# Patient Record
Sex: Male | Born: 1980 | Race: White | Hispanic: No | Smoking: Current every day smoker
Health system: Southern US, Community
[De-identification: ages and names within clinical notes are randomized; demographics above are authoritative.]

## PROBLEM LIST (undated history)

## (undated) DIAGNOSIS — S069XAA Unspecified intracranial injury with loss of consciousness status unknown, initial encounter: Secondary | ICD-10-CM

## (undated) DIAGNOSIS — S069X9A Unspecified intracranial injury with loss of consciousness of unspecified duration, initial encounter: Secondary | ICD-10-CM

---

## 2000-06-17 ENCOUNTER — Emergency Department (HOSPITAL_COMMUNITY): Admission: EM | Admit: 2000-06-17 | Discharge: 2000-06-17 | Payer: Self-pay | Admitting: Family Medicine

## 2001-03-20 ENCOUNTER — Emergency Department (HOSPITAL_COMMUNITY): Admission: EM | Admit: 2001-03-20 | Discharge: 2001-03-20 | Payer: Self-pay | Admitting: Emergency Medicine

## 2004-05-05 ENCOUNTER — Emergency Department (HOSPITAL_COMMUNITY): Admission: EM | Admit: 2004-05-05 | Discharge: 2004-05-05 | Payer: Self-pay | Admitting: Emergency Medicine

## 2006-09-25 ENCOUNTER — Emergency Department (HOSPITAL_COMMUNITY): Admission: EM | Admit: 2006-09-25 | Discharge: 2006-09-26 | Payer: Self-pay | Admitting: Emergency Medicine

## 2008-03-28 ENCOUNTER — Emergency Department (HOSPITAL_COMMUNITY): Admission: EM | Admit: 2008-03-28 | Discharge: 2008-03-28 | Payer: Self-pay | Admitting: Emergency Medicine

## 2010-05-29 ENCOUNTER — Emergency Department (HOSPITAL_COMMUNITY): Admission: EM | Admit: 2010-05-29 | Discharge: 2010-05-30 | Payer: Self-pay | Admitting: Emergency Medicine

## 2011-03-19 DIAGNOSIS — X500XXA Overexertion from strenuous movement or load, initial encounter: Secondary | ICD-10-CM | POA: Insufficient documentation

## 2011-03-19 DIAGNOSIS — T148XXA Other injury of unspecified body region, initial encounter: Secondary | ICD-10-CM | POA: Insufficient documentation

## 2011-03-19 DIAGNOSIS — Y93K1 Activity, walking an animal: Secondary | ICD-10-CM | POA: Insufficient documentation

## 2011-03-20 ENCOUNTER — Emergency Department (HOSPITAL_COMMUNITY)
Admission: EM | Admit: 2011-03-20 | Discharge: 2011-03-20 | Disposition: A | Discharge status: Home / Self Care | Payer: BC Managed Care – PPO | Attending: Emergency Medicine | Admitting: Emergency Medicine

## 2011-07-22 ENCOUNTER — Emergency Department (HOSPITAL_COMMUNITY)
Admission: EM | Admit: 2011-07-22 | Discharge: 2011-07-23 | Disposition: A | Discharge status: Home / Self Care | Payer: BC Managed Care – PPO | Attending: Emergency Medicine | Admitting: Emergency Medicine

## 2011-07-22 DIAGNOSIS — Z23 Encounter for immunization: Secondary | ICD-10-CM | POA: Insufficient documentation

## 2011-07-22 DIAGNOSIS — S61209A Unspecified open wound of unspecified finger without damage to nail, initial encounter: Secondary | ICD-10-CM | POA: Insufficient documentation

## 2011-07-22 DIAGNOSIS — W540XXA Bitten by dog, initial encounter: Secondary | ICD-10-CM | POA: Insufficient documentation

## 2011-12-31 ENCOUNTER — Encounter (HOSPITAL_COMMUNITY): Payer: Self-pay | Admitting: Emergency Medicine

## 2011-12-31 ENCOUNTER — Emergency Department (HOSPITAL_COMMUNITY)
Admission: EM | Admit: 2011-12-31 | Discharge: 2011-12-31 | Disposition: A | Discharge status: Home / Self Care | Payer: BC Managed Care – PPO | Attending: Emergency Medicine | Admitting: Emergency Medicine

## 2011-12-31 DIAGNOSIS — R6883 Chills (without fever): Secondary | ICD-10-CM | POA: Insufficient documentation

## 2011-12-31 DIAGNOSIS — R599 Enlarged lymph nodes, unspecified: Secondary | ICD-10-CM | POA: Insufficient documentation

## 2011-12-31 DIAGNOSIS — J069 Acute upper respiratory infection, unspecified: Secondary | ICD-10-CM | POA: Insufficient documentation

## 2011-12-31 DIAGNOSIS — R05 Cough: Secondary | ICD-10-CM | POA: Insufficient documentation

## 2011-12-31 DIAGNOSIS — R220 Localized swelling, mass and lump, head: Secondary | ICD-10-CM | POA: Insufficient documentation

## 2011-12-31 DIAGNOSIS — R059 Cough, unspecified: Secondary | ICD-10-CM | POA: Insufficient documentation

## 2011-12-31 DIAGNOSIS — J3489 Other specified disorders of nose and nasal sinuses: Secondary | ICD-10-CM | POA: Insufficient documentation

## 2011-12-31 NOTE — ED Notes (Signed)
Pt alert,nad c/o URI s/s, onset Friday, resp even unlabored, skin pwd

## 2011-12-31 NOTE — ED Provider Notes (Signed)
History     CSN: [id number]  Arrival date & time 12/31/11  2004   First MD Initiated Contact with Patient 12/31/11 2241      Chief Complaint  Patient presents with  . URI    (Consider location/radiation/quality/duration/timing/severity/associated sxs/prior treatment) Patient is a 31 y.o. male presenting with cough. The history is provided by the patient.  Cough This is a new problem. Episode onset: 3 days ago. The problem has been gradually worsening. The cough is productive of sputum. There has been no fever. Associated symptoms include chills and rhinorrhea. Pertinent negatives include no chest pain, no sweats, no weight loss, no ear congestion, no ear pain, no headaches, no sore throat, no myalgias, no shortness of breath and no wheezing. He has tried nothing for the symptoms. He is a smoker. His past medical history does not include pneumonia or asthma.    History reviewed. No pertinent past medical history.  History reviewed. No pertinent past surgical history.  No family history on file.  History  Substance Use Topics  . Smoking status: Current Everyday Smoker    Types: Cigarettes  . Smokeless tobacco: Not on file  . Alcohol Use: No      Review of Systems  Constitutional: Positive for chills. Negative for weight loss.  HENT: Positive for rhinorrhea. Negative for ear pain and sore throat.   Respiratory: Positive for cough. Negative for shortness of breath and wheezing.   Cardiovascular: Negative for chest pain.  Musculoskeletal: Negative for myalgias.  Neurological: Negative for headaches.    Allergies  Review of patient's allergies indicates no known allergies.  Home Medications  No current outpatient prescriptions on file.  BP 113/81  Pulse 94  Temp 98 F (36.7 C)  Resp 16  Wt 280 lb (127.007 kg)  SpO2 93%  Physical Exam  Nursing note and vitals reviewed. Constitutional: He is oriented to person, place, and time. He appears well-developed and  well-nourished. No distress.  HENT:  Head: Normocephalic and atraumatic.  Right Ear: Tympanic membrane and ear canal normal.  Left Ear: Tympanic membrane and ear canal normal.  Nose: Mucosal edema and rhinorrhea present.  Mouth/Throat: Uvula is midline, oropharynx is clear and moist and mucous membranes are normal.  Neck: Normal range of motion. Neck supple.  Cardiovascular: Normal rate, regular rhythm and normal heart sounds.   Pulmonary/Chest: Effort normal. No accessory muscle usage. Not tachypneic. No respiratory distress. He has no decreased breath sounds. He has wheezes. He has no rhonchi. He has no rales.       Inspiratory and expiratory wheezing of the right upper lung and right lower lung.  Lymphadenopathy:    He has cervical adenopathy.  Neurological: He is alert and oriented to person, place, and time.  Skin: Skin is warm and dry. No rash noted. He is not diaphoretic.  Psychiatric: He has a normal mood and affect.    ED Course  Procedures (including critical care time)  Labs Reviewed - No data to display No results found.   No diagnosis found.  Patient refused CXR.  Wanted to order a chest xray due to localized inspiratory and expiratory wheezing on exam and pulse ox 93.  Patient does smoke more than 1ppd.  Therefore, pulse ox of 93 may be baseline for him.  Patient is afebrile.  MDM  Feel that symptoms are most likely Viral URI.  Patient refused CXR.  Patient not in any respiratory distress.  Patient educated on smoking cessation and instructed to return to  ED if symptoms worsen.  Patient in agreement.        Pascal Lux [place], PA-C 01/01/12 [phone]

## 2011-12-31 NOTE — Discharge Instructions (Signed)
Read the instructions below on reasons to return to the emergency department and to learn more about your diagnosis.  Use over the counter medications for symptomatic relief as we discussed (mucinex as a decongestant, Tylenol for fever/pain, Motrin/Ibuprofen for muscle aches). If prescribed a cough suppressant during your visit, do not operate heavy machinery with in 5 hours of taking this medication. Followup with your primary care doctor in 4 days if your symptoms persist.  Your more than welcome to return to the emergency department if symptoms worsen or become concerning.  Upper Respiratory Infection, Adult  An upper respiratory infection (URI) is also sometimes known as the common cold. Most people improve within 1 week, but symptoms can last up to 2 weeks. A residual cough may last even longer.   URI is most commonly caused by a virus. Viruses are NOT treated with antibiotics. You can easily spread the virus to others by oral contact. This includes kissing, sharing a glass, coughing, or sneezing. Touching your mouth or nose and then touching a surface, which is then touched by another person, can also spread the virus.   TREATMENT  Treatment is directed at relieving symptoms. There is no cure. Antibiotics are not effective, because the infection is caused by a virus, not by bacteria. Treatment may include:  Increased fluid intake. Sports drinks offer valuable electrolytes, sugars, and fluids.  Breathing heated mist or steam (vaporizer or shower).  Eating chicken soup or other clear broths, and maintaining good nutrition.  Getting plenty of rest.  Using gargles or lozenges for comfort.  Controlling fevers with ibuprofen or acetaminophen as directed by your caregiver.  Increasing usage of your inhaler if you have asthma.  Return to work when your temperature has returned to normal.   SEEK MEDICAL CARE IF:  After the first few days, you feel you are getting worse rather than better.  You  develop worsening shortness of breath, or brown or red sputum. These may be signs of pneumonia.  You develop yellow or brown nasal discharge or pain in the face, especially when you bend forward. These may be signs of sinusitis.  You develop a fever, swollen neck glands, pain with swallowing, or white areas in the back of your throat. These may be signs of strep throat.   RESOURCE GUIDE  Dental Problems  Patients with Medicaid: [facility] [phone] [address]                                           [phone] [address] Phone:  [phone]                                                  Phone:  [phone]  If unable to pay or uninsured, contact:  [facility] or [facility]. to become qualified for the adult dental clinic.  Chronic Pain Problems Contact [facility]  [phone] Patients need to be referred by their primary care doctor.  Insufficient Money for Medicine Contact [employer]:  call "211" or [facility] 301-053-9099.  No Primary Care Doctor  Call [facility]  [phone] Other agencies that provide inexpensive medical care    [facility]  [phone]    [facility]  [phone]    [facility]  [phone]    [facility]  [phone]    [facility]  [phone]    [facility]  [phone]  Psychological Services [facility]  [phone] [facility] Services  [phone] [facility]   [phone] (emergency services [phone])  Substance Abuse Resources [facility]  938-386-1483 [facility] [phone] [facility] [phone] Floydene Flock [phone] Residential & Outpatient Substance Abuse Program  [phone]  Abuse/Neglect [facility] Child Abuse Hotline 8146443279 [facility] Child Abuse Hotline 321-653-0779 (After Hours)  Emergency [facility]ter [facility] [phone]  M[facility] [place] [facility] [phone] [facility] 9085467788  MRSA Hotline #:   858-857-0103    [facility] Resources  [facility]     [employer]                          [facility] Dept. [address] [place]                       [address]      [address]  Blondell Reveal Phone:  [phone]                                   Phone:  [phone]                 Phone:  [phone]  [facility] Mental Health Phone:  [phone]  [facility] Child Abuse Hotline ([phone] 007-1993 [phone] (After Hours)

## 2012-01-01 NOTE — ED Provider Notes (Signed)
Medical screening examination/treatment/procedure(s) were performed by non-physician practitioner and as supervising physician I was immediately available for consultation/collaboration.   Tu Shimmel M Karita Dralle, MD 01/01/12 0936 

## 2013-01-23 ENCOUNTER — Encounter (HOSPITAL_COMMUNITY): Payer: Self-pay | Admitting: Emergency Medicine

## 2013-01-23 ENCOUNTER — Emergency Department (HOSPITAL_COMMUNITY)
Admission: EM | Admit: 2013-01-23 | Discharge: 2013-01-23 | Disposition: A | Discharge status: Home / Self Care | Payer: BC Managed Care – PPO | Attending: Emergency Medicine | Admitting: Emergency Medicine

## 2013-01-23 DIAGNOSIS — Y9389 Activity, other specified: Secondary | ICD-10-CM | POA: Insufficient documentation

## 2013-01-23 DIAGNOSIS — M545 Low back pain, unspecified: Secondary | ICD-10-CM | POA: Insufficient documentation

## 2013-01-23 DIAGNOSIS — Y929 Unspecified place or not applicable: Secondary | ICD-10-CM | POA: Insufficient documentation

## 2013-01-23 DIAGNOSIS — F17200 Nicotine dependence, unspecified, uncomplicated: Secondary | ICD-10-CM | POA: Insufficient documentation

## 2013-01-23 DIAGNOSIS — T148XXA Other injury of unspecified body region, initial encounter: Secondary | ICD-10-CM | POA: Insufficient documentation

## 2013-01-23 DIAGNOSIS — X58XXXA Exposure to other specified factors, initial encounter: Secondary | ICD-10-CM | POA: Insufficient documentation

## 2013-01-23 LAB — POCT I-STAT, CHEM 8
Calcium, Ion: 1.2 mmol/L (ref 1.12–1.23)
Creatinine, Ser: 0.9 mg/dL (ref 0.50–1.35)
Glucose, Bld: 88 mg/dL (ref 70–99)
HCT: 49 % (ref 39.0–52.0)
Hemoglobin: 16.7 g/dL (ref 13.0–17.0)
Sodium: 141 mEq/L (ref 135–145)
TCO2: 31 mmol/L (ref 0–100)

## 2013-01-23 LAB — URINALYSIS, ROUTINE W REFLEX MICROSCOPIC
Hgb urine dipstick: NEGATIVE
Leukocytes, UA: NEGATIVE
Specific Gravity, Urine: 1.011 (ref 1.005–1.030)
Urobilinogen, UA: 0.2 mg/dL (ref 0.0–1.0)

## 2013-01-23 MED ORDER — NAPROXEN 500 MG PO TABS: 500.0000 mg | ORAL_TABLET | Freq: Two times a day (BID) | ORAL | Status: DC

## 2013-01-23 NOTE — ED Provider Notes (Signed)
History     CSN: [id number]  Arrival date & time 01/23/13  2026   First MD Initiated Contact with Patient 01/23/13 2103      Chief Complaint  Patient presents with  . Flank Pain   HPI  History provided by the patient. Patient is a 32 year old male with no significant PMH who presents with complaints of right-sided back and flank pains. Pain has been waxing and waning for the past 3 days. He denies any significant injury or trauma. No strenuous activities. Patient does work for [employer] but has had normal activity. Pain does not radiate. Pain is sharp and cramping. It is sometimes worse with position and walking but not necessarily. Patient is not using treatment medications for symptoms. Denies any other aggravating or alleviating factors. No other associated symptoms. No fever, chills or sweats. No nausea vomiting. No diarrhea or constipation. No urinary complaints.    History reviewed. No pertinent past medical history.  History reviewed. No pertinent past surgical history.  No family history on file.  History  Substance Use Topics  . Smoking status: Current Every Day Smoker    Types: Cigarettes  . Smokeless tobacco: Not on file  . Alcohol Use: No      Review of Systems  Constitutional: Negative for fever, chills, diaphoresis and appetite change.  Respiratory: Negative for shortness of breath.   Cardiovascular: Negative for chest pain.  Gastrointestinal: Negative for nausea, vomiting, abdominal pain, diarrhea and constipation.  Genitourinary: Positive for flank pain. Negative for dysuria, frequency, hematuria, discharge, penile swelling, scrotal swelling, penile pain and testicular pain.  Musculoskeletal: Positive for back pain.  All other systems reviewed and are negative.    Allergies  Review of patient's allergies indicates no known allergies.  Home Medications  No current outpatient prescriptions on file.  BP 134/62  Pulse 62  Temp(Src) 98.2 F (36.8 C) (Oral)   Resp 15  Ht 5\' 10"  (1.778 m)  Wt 280 lb (127.007 kg)  BMI 40.18 kg/m2  SpO2 95%  Physical Exam  Nursing note and vitals reviewed. Constitutional: He is oriented to person, place, and time. He appears well-developed and well-nourished. No distress.  HENT:  Head: Normocephalic.  Cardiovascular: Normal rate and regular rhythm.   Pulmonary/Chest: Effort normal and breath sounds normal. No respiratory distress. He has no wheezes. He has no rales.  Abdominal: Soft. There is no tenderness.  Musculoskeletal:       Lumbar back: He exhibits tenderness. He exhibits no bony tenderness and no swelling.       Back:  Neurological: He is alert and oriented to person, place, and time.  Skin: Skin is warm.  Psychiatric: He has a normal mood and affect.    ED Course  Procedures   Results for orders placed during the hospital encounter of 01/23/13  URINALYSIS, ROUTINE W REFLEX MICROSCOPIC      Result Value Range   Color, Urine YELLOW  YELLOW   APPearance CLEAR  CLEAR   Specific Gravity, Urine 1.011  1.005 - 1.030   pH 7.0  5.0 - 8.0   Glucose, UA NEGATIVE  NEGATIVE mg/dL   Hgb urine dipstick NEGATIVE  NEGATIVE   Bilirubin Urine NEGATIVE  NEGATIVE   Ketones, ur NEGATIVE  NEGATIVE mg/dL   Protein, ur NEGATIVE  NEGATIVE mg/dL   Urobilinogen, UA 0.2  0.0 - 1.0 mg/dL   Nitrite NEGATIVE  NEGATIVE   Leukocytes, UA NEGATIVE  NEGATIVE  POCT I-STAT, CHEM 8      Result  Value Range   Sodium 141  135 - 145 mEq/L   Potassium 4.4  3.5 - 5.1 mEq/L   Chloride 104  96 - 112 mEq/L   BUN 11  6 - 23 mg/dL   Creatinine, Ser 4.03  0.50 - 1.35 mg/dL   Glucose, Bld 88  70 - 99 mg/dL   Calcium, Ion 4.74  1.12 - 1.23 mmol/L   TCO2 31  0 - 100 mmol/L   Hemoglobin 16.7  13.0 - 17.0 g/dL   HCT 25.9  56.3 - 87.5 %       1. Muscle strain       MDM  9:15PM patient seen and evaluated. Patient appears well in no acute distress. No signs of significant discomfort or pain.  Patient with unremarkable urine  and Saturday. Symptoms seem more musculoskeletal or in nature. No abdominal pains. Doubt kidney stone. At This time will give symptomatic treatment for muscle strain.      Angus Seller, PA-C 01/24/13 [phone]

## 2013-01-23 NOTE — ED Notes (Signed)
Pt made aware of need for urine specimen 

## 2013-01-23 NOTE — ED Notes (Signed)
Pt alert, arrives fom home, c/o right flank pain, onset was several days ago, describes pain as shooting, intermittent, resp even unlabored, skin pwd, denies changes in bowel or bladder habits

## 2013-01-24 NOTE — ED Provider Notes (Signed)
Medical screening examination/treatment/procedure(s) were performed by non-physician practitioner and as supervising physician I was immediately available for consultation/collaboration.  Christopher J. Pollina, MD 01/24/13 1850 

## 2015-05-26 ENCOUNTER — Encounter (HOSPITAL_COMMUNITY): Payer: Self-pay | Admitting: Emergency Medicine

## 2015-05-26 ENCOUNTER — Emergency Department (HOSPITAL_COMMUNITY)
Admission: EM | Admit: 2015-05-26 | Discharge: 2015-05-26 | Disposition: A | Discharge status: Home / Self Care | Payer: BLUE CROSS/BLUE SHIELD | Attending: Emergency Medicine | Admitting: Emergency Medicine

## 2015-05-26 DIAGNOSIS — Z791 Long term (current) use of non-steroidal anti-inflammatories (NSAID): Secondary | ICD-10-CM | POA: Diagnosis not present

## 2015-05-26 DIAGNOSIS — M549 Dorsalgia, unspecified: Secondary | ICD-10-CM | POA: Diagnosis present

## 2015-05-26 DIAGNOSIS — Z720 Tobacco use: Secondary | ICD-10-CM | POA: Diagnosis not present

## 2015-05-26 DIAGNOSIS — M62838 Other muscle spasm: Secondary | ICD-10-CM | POA: Insufficient documentation

## 2015-05-26 HISTORY — DX: Unspecified intracranial injury with loss of consciousness status unknown, initial encounter: S06.9XAA

## 2015-05-26 HISTORY — DX: Unspecified intracranial injury with loss of consciousness of unspecified duration, initial encounter: S06.9X9A

## 2015-05-26 MED ORDER — CYCLOBENZAPRINE HCL 10 MG PO TABS: 10.0000 mg | ORAL_TABLET | Freq: Two times a day (BID) | ORAL | Status: DC | PRN

## 2015-05-26 MED ORDER — IBUPROFEN 800 MG PO TABS: 800.0000 mg | ORAL_TABLET | Freq: Three times a day (TID) | ORAL | Status: DC

## 2015-05-26 NOTE — ED Provider Notes (Signed)
CSN: [id number]     Arrival date & time 05/26/15  1604 History  This chart was scribed for Roxy Horseman, PA-C, working with Rolan Bucco, MD by Octavia Heir, ED Scribe. This patient was seen in room WTR5/WTR5 and the patient's care was started at 4:43 PM.      Chief Complaint  Patient presents with  . Back Pain    pain below l/shoulder blade x 24 hours      The history is provided by the patient. No language interpreter was used.   HPI Comments: Shawn Li is a 34 y.o. male who presents to the Emergency Department complaining of pain under his left shoulder blade onset 2 days ago. 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 He reports taking OTC tylenol to alleviate the pain with no relief.   No past medical history on file. No past surgical history on file. No family history on file. History  Substance Use Topics  . Smoking status: Current Every Day Smoker    Types: Cigarettes  . Smokeless tobacco: Not on file  . Alcohol Use: No    Review of Systems  Musculoskeletal: Positive for back pain.      Allergies  Review of patient's allergies indicates no known allergies.  Home Medications   Prior to Admission medications   Medication Sig Start Date End Date Taking? Authorizing Provider  naproxen (NAPROSYN) 500 MG tablet Take 1 tablet (500 mg total) by mouth 2 (two) times daily. 01/23/13   Ivonne Andrew, PA-C   Triage vitals: BP 126/78 mmHg  Pulse 78  Temp(Src) 98.1 F (36.7 C) (Oral)  Resp 18  SpO2 95% Physical Exam  Constitutional: He is oriented to person, place, and time. He appears well-developed and well-nourished. No distress.  HENT:  Head: Normocephalic and atraumatic.  Eyes: Conjunctivae and EOM are normal. Right eye exhibits no discharge. Left eye exhibits no discharge. No scleral icterus.  Neck: Normal range of motion. Neck supple. No tracheal deviation present.  Cardiovascular: Normal rate, regular rhythm and  normal heart sounds.  Exam reveals no gallop and no friction rub.   No murmur heard. Pulmonary/Chest: Effort normal and breath sounds normal. No respiratory distress. He has no wheezes.  Abdominal: Soft. He exhibits no distension. There is no tenderness.  Musculoskeletal: Normal range of motion.  No paraspinal muscles tender to palpation, no bony tenderness, step-offs, or gross abnormality or deformity of spine, patient is able to ambulate, moves all extremities  Left rhomboids ttp  Bilateral great toe extension intact Bilateral plantar/dorsiflexion intact  Neurological: He is alert and oriented to person, place, and time. Coordination normal.  Sensation and strength intact bilaterally   Skin: Skin is warm. No rash noted. He is not diaphoretic.  Psychiatric: He has a normal mood and affect. His behavior is normal. Judgment and thought content normal.  Nursing note and vitals reviewed.   ED Course  Procedures  DIAGNOSTIC STUDIES: Oxygen Saturation is 95% on RA, adequate by my interpretation.  COORDINATION OF CARE:  4:46 PM Discussed treatment plan which includes pain medication and ice with pt at bedside and pt agreed to plan.  Labs Review Labs Reviewed - No data to display  Imaging Review No results found.   EKG Interpretation None      MDM   Final diagnoses:  Muscle spasm    Patient with back pain.  No neurological deficits and normal neuro exam.  Patient is ambulatory.  No loss of bowel or bladder control.  Doubt cauda equina.  Denies fever,  doubt epidural abscess or other lesion. Recommend back exercises, stretching, RICE, and will treat with a short course of flexeril.    I personally performed the services described in this documentation, which was scribed in my presence. The recorded information has been reviewed and is accurate.    Roxy Horseman, PA-C 05/26/15 1716  Rolan Bucco, MD 05/26/15 [phone]

## 2015-05-26 NOTE — Discharge Instructions (Signed)
Muscle Cramps and Spasms °Muscle cramps and spasms occur when a muscle or muscles tighten and you have no control over this tightening (involuntary muscle contraction). They are a common problem and can develop in any muscle. The most common place is in the calf muscles of the leg. Both muscle cramps and muscle spasms are involuntary muscle contractions, but they also have differences:  °· Muscle cramps are sporadic and painful. They may last a few seconds to a quarter of an hour. Muscle cramps are often more forceful and last longer than muscle spasms. °· Muscle spasms may or may not be painful. They may also last just a few seconds or much longer. °CAUSES  °It is uncommon for cramps or spasms to be due to a serious underlying problem. In many cases, the cause of cramps or spasms is unknown. Some common causes are:  °· Overexertion.   °· Overuse from repetitive motions (doing the same thing over and over).   °· Remaining in a certain position for a long period of time.   °· Improper preparation, form, or technique while performing a sport or activity.   °· Dehydration.   °· Injury.   °· Side effects of some medicines.   °· Abnormally low levels of the salts and ions in your blood (electrolytes), especially potassium and calcium. This could happen if you are taking water pills (diuretics) or you are pregnant.   °Some underlying medical problems can make it more likely to develop cramps or spasms. These include, but are not limited to:  °· Diabetes.   °· Parkinson disease.   °· Hormone disorders, such as thyroid problems.   °· Alcohol abuse.   °· Diseases specific to muscles, joints, and bones.   °· Blood vessel disease where not enough blood is getting to the muscles.   °HOME CARE INSTRUCTIONS  °· Stay well hydrated. Drink enough water and fluids to keep your urine clear or pale yellow. °· It may be helpful to massage, stretch, and relax the affected muscle. °· For tight or tense muscles, use a warm towel, heating  pad, or hot shower water directed to the affected area. °· If you are sore or have pain after a cramp or spasm, applying ice to the affected area may relieve discomfort. °¨ Put ice in a plastic bag. °¨ Place a towel between your skin and the bag. °¨ Leave the ice on for 15-20 minutes, 03-04 times a day. °· Medicines used to treat a known cause of cramps or spasms may help reduce their frequency or severity. Only take over-the-counter or prescription medicines as directed by your caregiver. °SEEK MEDICAL CARE IF:  °Your cramps or spasms get more severe, more frequent, or do not improve over time.  °MAKE SURE YOU:  °· Understand these instructions. °· Will watch your condition. °· Will get help right away if you are not doing well or get worse. °Document Released: 04/06/2002 Document Revised: 02/09/2013 Document Reviewed: 10/01/2012 °Exit[employer]® Patient Information ©2015 [employer]. This information is not intended to replace advice given to you by your health care provider. Make sure you discuss any questions you have with your health care provider. °Back Pain, Adult °Low back pain is very common. About 1 in 5 people have back pain. The cause of low back pain is rarely dangerous. The pain often gets better over time. About half of people with a sudden onset of back pain feel better in just 2 weeks. About 8 in 10 people feel better by 6 weeks.  °CAUSES °Some common causes of back pain include: °· Strain of   the muscles or ligaments supporting the spine. °· Wear and tear (degeneration) of the spinal discs. °· Arthritis. °· Direct injury to the back. °DIAGNOSIS °Most of the time, the direct cause of low back pain is not known. However, back pain can be treated effectively even when the exact cause of the pain is unknown. Answering your caregiver's questions about your overall health and symptoms is one of the most accurate ways to make sure the cause of your pain is not dangerous. If your caregiver needs more  information, he or she may order lab work or imaging tests (X-rays or MRIs). However, even if imaging tests show changes in your back, this usually does not require surgery. °HOME CARE INSTRUCTIONS °For many people, back pain returns. Since low back pain is rarely dangerous, it is often a condition that people can learn to manage on their own.  °· Remain active. It is stressful on the back to sit or stand in one place. Do not sit, drive, or stand in one place for more than 30 minutes at a time. Take short walks on level surfaces as soon as pain allows. Try to increase the length of time you walk each day. °· Do not stay in bed. Resting more than 1 or 2 days can delay your recovery. °· Do not avoid exercise or work. Your body is made to move. It is not dangerous to be active, even though your back may hurt. Your back will likely heal faster if you return to being active before your pain is gone. °· Pay attention to your body when you  bend and lift. Many people have less discomfort when lifting if they bend their knees, keep the load close to their bodies, and avoid twisting. Often, the most comfortable positions are those that put less stress on your recovering back. °· Find a comfortable position to sleep. Use a firm mattress and lie on your side with your knees slightly bent. If you lie on your back, put a pillow under your knees. °· Only take over-the-counter or prescription medicines as directed by your caregiver. Over-the-counter medicines to reduce pain and inflammation are often the most helpful. Your caregiver may prescribe muscle relaxant drugs. These medicines help dull your pain so you can more quickly return to your normal activities and healthy exercise. °· Put ice on the injured area. °· Put ice in a plastic bag. °· Place a towel between your skin and the bag. °· Leave the ice on for 15-20 minutes, 03-04 times a day for the first 2 to 3 days. After that, ice and heat may be alternated to reduce pain  and spasms. °· Ask your caregiver about trying back exercises and gentle massage. This may be of some benefit. °· Avoid feeling anxious or stressed. Stress increases muscle tension and can worsen back pain. It is important to recognize when you are anxious or stressed and learn ways to manage it. Exercise is a great option. °SEEK MEDICAL CARE IF: °· You have pain that is not relieved with rest or medicine. °· You have pain that does not improve in 1 week. °· You have new symptoms. °· You are generally not feeling well. °SEEK IMMEDIATE MEDICAL CARE IF:  °· You have pain that radiates from your back into your legs. °· You develop new bowel or bladder control problems. °· You have unusual weakness or numbness in your arms or legs. °· You develop nausea or vomiting. °· You develop abdominal pain. °· You feel faint. °Document Released: 10/15/2005 Document Revised: 04/15/2012 Document Reviewed: 02/16/2014 °ExitCare® Patient Information ©2015 [employer]. This   information is not intended to replace advice given to you by your health care provider. Make sure you discuss any questions you have with your health care provider. ° °

## 2015-05-26 NOTE — ED Notes (Signed)
Pt reports that he awoke with sharp pain under l/shoulder blade two days ago. He was able to tolerate going to work. Medicated with Tylenol twice

## 2016-06-13 ENCOUNTER — Emergency Department (HOSPITAL_COMMUNITY)
Admission: EM | Admit: 2016-06-13 | Discharge: 2016-06-13 | Disposition: A | Discharge status: Home / Self Care | Payer: BLUE CROSS/BLUE SHIELD | Attending: Emergency Medicine | Admitting: Emergency Medicine

## 2016-06-13 ENCOUNTER — Emergency Department (HOSPITAL_COMMUNITY): Payer: BLUE CROSS/BLUE SHIELD

## 2016-06-13 ENCOUNTER — Encounter (HOSPITAL_COMMUNITY): Payer: Self-pay | Admitting: Emergency Medicine

## 2016-06-13 DIAGNOSIS — Y9389 Activity, other specified: Secondary | ICD-10-CM | POA: Insufficient documentation

## 2016-06-13 DIAGNOSIS — F17210 Nicotine dependence, cigarettes, uncomplicated: Secondary | ICD-10-CM | POA: Insufficient documentation

## 2016-06-13 DIAGNOSIS — X500XXA Overexertion from strenuous movement or load, initial encounter: Secondary | ICD-10-CM | POA: Insufficient documentation

## 2016-06-13 DIAGNOSIS — Y929 Unspecified place or not applicable: Secondary | ICD-10-CM | POA: Diagnosis not present

## 2016-06-13 DIAGNOSIS — Z791 Long term (current) use of non-steroidal anti-inflammatories (NSAID): Secondary | ICD-10-CM | POA: Diagnosis not present

## 2016-06-13 DIAGNOSIS — S46912A Strain of unspecified muscle, fascia and tendon at shoulder and upper arm level, left arm, initial encounter: Secondary | ICD-10-CM | POA: Insufficient documentation

## 2016-06-13 DIAGNOSIS — Y999 Unspecified external cause status: Secondary | ICD-10-CM | POA: Diagnosis not present

## 2016-06-13 DIAGNOSIS — M25512 Pain in left shoulder: Secondary | ICD-10-CM | POA: Diagnosis present

## 2016-06-13 MED ORDER — IBUPROFEN 800 MG PO TABS: 800.0000 mg | ORAL_TABLET | Freq: Three times a day (TID) | ORAL | 0 refills | Status: AC

## 2016-06-13 MED ORDER — TRAMADOL HCL 50 MG PO TABS: 50.0000 mg | ORAL_TABLET | Freq: Four times a day (QID) | ORAL | 0 refills | Status: DC | PRN

## 2016-06-13 MED ORDER — CYCLOBENZAPRINE HCL 10 MG PO TABS: 10.0000 mg | ORAL_TABLET | Freq: Two times a day (BID) | ORAL | 0 refills | Status: DC | PRN

## 2016-06-13 NOTE — ED Provider Notes (Signed)
[facility]-EMERGENCY DEPT Provider Note   CSN: [id number] Arrival date & time: 06/13/16  1651  By signing my name below, I, Placido SouLogan Joldersma, attest that this documentation has been prepared under the direction and in the presence of Danelle BerryLeisa Thornton Dohrmann, PA-C. Electronically Signed: Placido SouLogan Joldersma, ED Scribe. 06/13/16. 7:13 PM.   History   Chief Complaint Chief Complaint  Patient presents with  . Arm Pain  . Shoulder Pain    HPI HPI Comments: Shawn Li is a 35 y.o. male who presents to the Emergency Department complaining of worsening, moderate, left posterior shoulder pain x 2 days. He states his pain began in his left bicep and beginning this morning states his pain worsened and spread to his left posterior shoulder. Pt states he was lifting his young son above his head prior to the onset of his symptoms noting he works for [employer] and does frequent heavy lifting, which he believes worsened his pain. He denies any known drug allergies. Pt denies any other associated symptoms at this time.   The history is provided by the patient. No language interpreter was used.    Past Medical History:  Diagnosis Date  . Brain trauma (HCC)     There are no active problems to display for this patient.   History reviewed. No pertinent surgical history.   Home Medications    Prior to Admission medications   Medication Sig Start Date End Date Taking? Authorizing Provider  cyclobenzaprine (FLEXERIL) 10 MG tablet Take 1 tablet (10 mg total) by mouth 2 (two) times daily as needed for muscle spasms. 05/26/15   Roxy Horsemanobert Browning, PA-C  ibuprofen (ADVIL,MOTRIN) 800 MG tablet Take 1 tablet (800 mg total) by mouth 3 (three) times daily. 05/26/15   Roxy Horsemanobert Browning, PA-C  naproxen (NAPROSYN) 500 MG tablet Take 1 tablet (500 mg total) by mouth 2 (two) times daily. 01/23/13   Ivonne AndrewPeter Dammen, PA-C    Family History Family History  Problem Relation Age of Onset  . Cancer Mother     Social History Social History    Substance Use Topics  . Smoking status: Current Every Day Smoker    Types: Cigarettes  . Smokeless tobacco: Never Used  . Alcohol use Yes     Comment: occ     Allergies   Review of patient's allergies indicates no known allergies.   Review of Systems Review of Systems  Musculoskeletal: Positive for arthralgias. Negative for joint swelling.  Skin: Negative for color change and wound.  All other systems reviewed and are negative.  Physical Exam Updated Vital Signs BP 109/76 (BP Location: Right Arm)   Pulse 81   Temp 98.7 F (37.1 C) (Oral)   Resp 18   SpO2 96%   Physical Exam  Constitutional: He is oriented to person, place, and time. He appears well-developed and well-nourished.  HENT:  Head: Normocephalic and atraumatic.  Eyes: EOM are normal.  Neck: Normal range of motion.  Cardiovascular: Normal rate.   Pulmonary/Chest: Effort normal. No respiratory distress.  Abdominal: Soft.  Musculoskeletal:       Left shoulder: He exhibits decreased range of motion and pain. He exhibits no bony tenderness, no swelling, no effusion, no crepitus, no deformity, no spasm, normal pulse and normal strength.  Left shoulder:  Positive Apley's scratch test. Negative drop test. Negative empty can test. No TTP. Exam somewhat limited by pain.  Pt would not allow passive (and would not actively) flex arm (both abducted and adducted) much beyond 90 degrees, however  when asked to do apley's scratch test, could flex to nearly 180  Neurological: He is alert and oriented to person, place, and time.  Skin: Skin is warm and dry.  Psychiatric: He has a normal mood and affect.  Nursing note and vitals reviewed.  ED Treatments / Results  Labs (all labs ordered are listed, but only abnormal results are displayed) Labs Reviewed - No data to display  EKG  EKG Interpretation None       Radiology Dg Shoulder Left  Result Date: 06/13/2016 CLINICAL DATA:  Left shoulder pain for 2 days. EXAM:  LEFT SHOULDER - 2+ VIEW COMPARISON:  None. FINDINGS: There is no evidence of fracture or dislocation. There is no evidence of arthropathy or other focal bone abnormality. Soft tissues are unremarkable. IMPRESSION: Negative left shoulder radiographs. Electronically Signed   By: Marin Robertshristopher  Mattern M.D.   On: 06/13/2016 17:58    Procedures Procedures  DIAGNOSTIC STUDIES: Oxygen Saturation is 96% on RA, normal by my interpretation.    COORDINATION OF CARE: 7:11 PM Discussed next steps with pt. Pt verbalized understanding and is agreeable with the plan.    Medications Ordered in ED Medications - No data to display   Initial Impression / Assessment and Plan / ED Course  I have reviewed the triage vital signs and the nursing notes.  Pertinent labs & imaging results that were available during my care of the patient were reviewed by me and considered in my medical decision making (see chart for details).  Clinical Course   Pt with left shoulder strain. Patient X-Ray negative for obvious fracture or dislocation.  Suspect pt has mild rotator cuff injury, no instability, but pain and difficulty with ROM.  Pt advised to follow up with orthopedics if not improving in 1-2 weeks with conservative/RICE tx.. Patient given sling while in ED, conservative therapy recommended and discussed. Patient will be discharged home & is agreeable with above plan. Returns precautions discussed. Pt appears safe for discharge.  I personally performed the services described in this documentation, which was scribed in my presence. The recorded information has been reviewed and is accurate.    Final Clinical Impressions(s) / ED Diagnoses   Final diagnoses:  Left shoulder strain, initial encounter    New Prescriptions Discharge Medication List as of 06/13/2016  7:26 PM    START taking these medications   Details  traMADol (ULTRAM) 50 MG tablet Take 1 tablet (50 mg total) by mouth every 6 (six) hours as needed.,  Starting Wed 06/13/2016, Print         Danelle BerryLeisa Christen Bedoya, PA-C 06/16/16 0119    Mancel BaleElliott Wentz, MD 06/18/16 2118

## 2016-06-13 NOTE — ED Triage Notes (Signed)
Patient reports two days ago pain started in left upper arm and today woke up with left shoulder pain. Denies injury to same. No SOB, CP, lightheadedness, dizziness, N/V, weakness, back pain, numbness or tingling. A&O x4.

## 2017-04-07 ENCOUNTER — Emergency Department (HOSPITAL_COMMUNITY)
Admission: EM | Admit: 2017-04-07 | Discharge: 2017-04-07 | Discharge status: Against Medical Advice | Payer: BLUE CROSS/BLUE SHIELD

## 2017-04-07 NOTE — ED Notes (Signed)
Pt c/o 6/10 intermittent ankle pain if bearing weight not related to injury. Pt stood up and fell back down because of the pain. Presenting with swelling and CMS intact.

## 2017-04-08 ENCOUNTER — Emergency Department (HOSPITAL_COMMUNITY): Payer: BLUE CROSS/BLUE SHIELD

## 2017-04-08 ENCOUNTER — Emergency Department (HOSPITAL_COMMUNITY)
Admission: EM | Admit: 2017-04-08 | Discharge: 2017-04-08 | Disposition: A | Discharge status: Home / Self Care | Payer: BLUE CROSS/BLUE SHIELD | Attending: Emergency Medicine | Admitting: Emergency Medicine

## 2017-04-08 ENCOUNTER — Encounter (HOSPITAL_COMMUNITY): Payer: Self-pay

## 2017-04-08 DIAGNOSIS — Z79899 Other long term (current) drug therapy: Secondary | ICD-10-CM | POA: Diagnosis not present

## 2017-04-08 DIAGNOSIS — Y999 Unspecified external cause status: Secondary | ICD-10-CM | POA: Insufficient documentation

## 2017-04-08 DIAGNOSIS — F17210 Nicotine dependence, cigarettes, uncomplicated: Secondary | ICD-10-CM | POA: Diagnosis not present

## 2017-04-08 DIAGNOSIS — X509XXA Other and unspecified overexertion or strenuous movements or postures, initial encounter: Secondary | ICD-10-CM | POA: Insufficient documentation

## 2017-04-08 DIAGNOSIS — S8252XA Displaced fracture of medial malleolus of left tibia, initial encounter for closed fracture: Secondary | ICD-10-CM

## 2017-04-08 DIAGNOSIS — S99912A Unspecified injury of left ankle, initial encounter: Secondary | ICD-10-CM | POA: Diagnosis present

## 2017-04-08 DIAGNOSIS — Y929 Unspecified place or not applicable: Secondary | ICD-10-CM | POA: Insufficient documentation

## 2017-04-08 DIAGNOSIS — Y939 Activity, unspecified: Secondary | ICD-10-CM | POA: Insufficient documentation

## 2017-04-08 MED ORDER — IBUPROFEN 800 MG PO TABS
800.0000 mg | ORAL_TABLET | Freq: Once | ORAL | Status: AC
  Administered 2017-04-08: 800 mg via ORAL
  Filled 2017-04-08 (×2): qty 1

## 2017-04-08 MED ORDER — MELOXICAM 15 MG PO TABS: 15.0000 mg | ORAL_TABLET | Freq: Every day | ORAL | 0 refills | Status: AC

## 2017-04-08 MED ORDER — TRAMADOL HCL 50 MG PO TABS: 50.0000 mg | ORAL_TABLET | Freq: Four times a day (QID) | ORAL | 0 refills | Status: AC | PRN

## 2017-04-08 NOTE — Discharge Instructions (Signed)
Get help right away if: Your cast gets damaged or breaks. You have continued severe pain. You develop new pain or swelling after the cast was put on. Your skin or toenails below the injury turn blue or gray. Your skin or toenails below the injury feel cold, numb, or have loss of sensitivity to touch. There is a bad smell or pus draining from under the cast.

## 2017-04-08 NOTE — ED Provider Notes (Signed)
[facility]-EMERGENCY DEPT Provider Note   CSN: [id number] Arrival date & time: 04/08/17  0715     History   Chief Complaint Chief Complaint  Patient presents with  . Ankle Injury    HPI Shawn Li is a 36 y.o. male who presents emergency Department with chief complaint of left ankle pain. Patient states that he was making a bowel movement yesterday and sat on the commode for an extended amount of time. He states that both of his legs went to sleep. When he stood up. He states he is not sure what happened, but he had immediate severe pain in his left foot and ankle and fell to the floor. He did this around 10 PM last night. He was able to apply a small amount of weight to the foot, however, severe pain. He presents for evaluation today with severe swelling in the foot, ankle and up to the midcalf with bruising on the lateral aspect of his ankle. He complains of pain in the great toe, pain in the ankle and pain in his shin. He said previous ankle sprains before, however, this is "far worse" than his other previous sprains. He denies any current numbness or tingling. He denies calf pain.  HPI  Past Medical History:  Diagnosis Date  . Brain trauma (HCC)     There are no active problems to display for this patient.   History reviewed. No pertinent surgical history.     Home Medications    Prior to Admission medications   Medication Sig Start Date End Date Taking? Authorizing Provider  cyclobenzaprine (FLEXERIL) 10 MG tablet Take 1 tablet (10 mg total) by mouth 2 (two) times daily as needed for muscle spasms. 06/13/16   Danelle Berryapia, Leisa, PA-C  ibuprofen (ADVIL,MOTRIN) 800 MG tablet Take 1 tablet (800 mg total) by mouth 3 (three) times daily. 06/13/16   Danelle Berryapia, Leisa, PA-C  naproxen (NAPROSYN) 500 MG tablet Take 1 tablet (500 mg total) by mouth 2 (two) times daily. 01/23/13   Ivonne Andrewammen, Peter, PA-C  traMADol (ULTRAM) 50 MG tablet Take 1 tablet (50 mg total) by mouth every 6 (six) hours as  needed. 06/13/16   Danelle Berryapia, Leisa, PA-C    Family History Family History  Problem Relation Age of Onset  . Cancer Mother     Social History Social History  Substance Use Topics  . Smoking status: Current Every Day Smoker    Types: Cigarettes  . Smokeless tobacco: Never Used  . Alcohol use Yes     Comment: occ     Allergies   Patient has no known allergies.   Review of Systems Review of Systems  Ten systems reviewed and are negative for acute change, except as noted in the HPI.   Physical Exam Updated Vital Signs BP 137/76 (BP Location: Left Arm)   Pulse 71   Temp 98.3 F (36.8 C) (Oral)   Resp 16   Ht 5\' 10"  (1.778 m)   Wt 127 kg (280 lb)   SpO2 95%   BMI 40.18 kg/m   Physical Exam  Constitutional: He appears well-developed and well-nourished. No distress.  HENT:  Head: Normocephalic and atraumatic.  Eyes: Conjunctivae are normal. No scleral icterus.  Neck: Normal range of motion. Neck supple.  Cardiovascular: Normal rate, regular rhythm and normal heart sounds.   Pulmonary/Chest: Effort normal and breath sounds normal. No respiratory distress.  Abdominal: Soft. There is no tenderness.  Musculoskeletal: He exhibits no edema.  Left ankle with significant swelling  in the foot, ankle and up to the mid calf. There is bruising along the posterior aspect of the lateral malleolus. Patient is tender across the mid foot, bilateral malleoli and shin. Range of motion is deferred due to significant pain. Normal DP and PT pulses.  Neurological: He is alert.  Skin: Skin is warm and dry. He is not diaphoretic.  Psychiatric: His behavior is normal.  Nursing note and vitals reviewed.    ED Treatments / Results  Labs (all labs ordered are listed, but only abnormal results are displayed) Labs Reviewed - No data to display  EKG  EKG Interpretation None       Radiology No results found.  Procedures Procedures (including critical care time) SPLINT  APPLICATION Date/Time: 9:18 AM Authorized by: Arthor Captain Consent: Verbal consent obtained. Risks and benefits: risks, benefits and alternatives were discussed Consent given by: patient Splint applied by: orthopedic technician Location details: left ankle  Splint type: Cam walker Post-procedure: The splinted body part was neurovascularly unchanged following the procedure. Patient tolerance: Patient tolerated the procedure well with no immediate complications.    Medications Ordered in ED Medications - No data to display   Initial Impression / Assessment and Plan / ED Course  I have reviewed the triage vital signs and the nursing notes.  Pertinent labs & imaging results that were available during my care of the patient were reviewed by me and considered in my medical decision making (see chart for details).       Patient X-Ray with small avulsion of the medial malleolus. Fracture is stable.. Pain managed in ED. Pt advised to follow up with orthopedics. Patient given brace while in ED, conservative therapy recommended and discussed. Patient will be dc home & is agreeable with above plan.   Final Clinical Impressions(s) / ED Diagnoses   Final diagnoses:  Left ankle injury, initial encounter  Closed avulsion fracture of medial malleolus of left tibia, initial encounter    New Prescriptions New Prescriptions   No medications on file     Arthor Captain, PA-C 04/08/17 1619    Cathren Laine, MD 04/09/17 [id number]

## 2017-04-08 NOTE — ED Triage Notes (Signed)
Patient states his foot was asleep and when he stood he rolled his left ankle at 2200 lst night.

## 2021-12-07 ENCOUNTER — Emergency Department (HOSPITAL_COMMUNITY): Payer: BC Managed Care – PPO

## 2021-12-07 ENCOUNTER — Emergency Department (HOSPITAL_COMMUNITY)
Admission: EM | Admit: 2021-12-07 | Discharge: 2021-12-08 | Disposition: A | Discharge status: Home / Self Care | Payer: BC Managed Care – PPO | Attending: Emergency Medicine | Admitting: Emergency Medicine

## 2021-12-07 ENCOUNTER — Other Ambulatory Visit: Payer: Self-pay

## 2021-12-07 ENCOUNTER — Encounter (HOSPITAL_COMMUNITY): Payer: Self-pay | Admitting: Emergency Medicine

## 2021-12-07 DIAGNOSIS — J020 Streptococcal pharyngitis: Secondary | ICD-10-CM | POA: Diagnosis not present

## 2021-12-07 DIAGNOSIS — J029 Acute pharyngitis, unspecified: Secondary | ICD-10-CM | POA: Diagnosis present

## 2021-12-07 DIAGNOSIS — Z20822 Contact with and (suspected) exposure to covid-19: Secondary | ICD-10-CM | POA: Diagnosis not present

## 2021-12-07 DIAGNOSIS — J36 Peritonsillar abscess: Secondary | ICD-10-CM

## 2021-12-07 LAB — BASIC METABOLIC PANEL
Anion gap: 9 (ref 5–15)
BUN: 8 mg/dL (ref 6–20)
CO2: 25 mmol/L (ref 22–32)
Calcium: 8.8 mg/dL — ABNORMAL LOW (ref 8.9–10.3)
Chloride: 101 mmol/L (ref 98–111)
Creatinine, Ser: 0.91 mg/dL (ref 0.61–1.24)
GFR, Estimated: >60 mL/min (ref >60)
Glucose, Bld: 111 mg/dL — ABNORMAL HIGH (ref 70–99)
Potassium: 4.3 mmol/L (ref 3.5–5.1)
Sodium: 135 mmol/L (ref 135–145)

## 2021-12-07 LAB — RESP PANEL BY RT-PCR (FLU A&B, COVID) ARPGX2
Influenza A by PCR: NEGATIVE
Influenza B by PCR: NEGATIVE
SARS Coronavirus 2 by RT PCR: NEGATIVE

## 2021-12-07 LAB — CBC WITH DIFFERENTIAL/PLATELET
Abs Immature Granulocytes: 0.12 K/uL — ABNORMAL HIGH (ref 0.00–0.07)
Basophils Absolute: 0.1 K/uL (ref 0.0–0.1)
Basophils Relative: 0 %
Eosinophils Absolute: 0 K/uL (ref 0.0–0.5)
Eosinophils Relative: 0 %
HCT: 41.3 % (ref 39.0–52.0)
Hemoglobin: 13.7 g/dL (ref 13.0–17.0)
Immature Granulocytes: 1 %
Lymphocytes Relative: 5 %
Lymphs Abs: 1.1 K/uL (ref 0.7–4.0)
MCH: 31.4 pg (ref 26.0–34.0)
MCHC: 33.2 g/dL (ref 30.0–36.0)
MCV: 94.5 fL (ref 80.0–100.0)
Monocytes Absolute: 0.5 K/uL (ref 0.1–1.0)
Monocytes Relative: 2 %
Neutro Abs: 19.7 K/uL — ABNORMAL HIGH (ref 1.7–7.7)
Neutrophils Relative %: 92 %
Platelets: 449 K/uL — ABNORMAL HIGH (ref 150–400)
RBC: 4.37 MIL/uL (ref 4.22–5.81)
RDW: 13.2 % (ref 11.5–15.5)
WBC: 21.6 K/uL — ABNORMAL HIGH (ref 4.0–10.5)
nRBC: 0 % (ref 0.0–0.2)

## 2021-12-07 LAB — GROUP A STREP BY PCR: Group A Strep by PCR: DETECTED — AB

## 2021-12-07 MED ORDER — IOHEXOL 300 MG/ML  SOLN
75.0000 mL | Freq: Once | INTRAMUSCULAR | Status: AC | PRN
  Administered 2021-12-07: 75 mL via INTRAVENOUS

## 2021-12-07 MED ORDER — SODIUM CHLORIDE 0.9 % IV BOLUS: 1000.0000 mL | Freq: Once | INTRAVENOUS | Status: DC

## 2021-12-07 NOTE — ED Triage Notes (Signed)
Patient reports left sore throat with swelling onset yesterday , he received Rocephin/Decadron IM at urgent care this evening , no fever or chills .

## 2021-12-07 NOTE — ED Provider Triage Note (Signed)
Emergency Medicine Provider Triage Evaluation Note  ARMSTRONG CREASY II , a 41 y.o. adult  was evaluated in triage.  Pt complains of left-sided sore throat x24 hours, pain worse with swallowing and talking with change in voice.  Denies fever, no similar symptoms previously  Review of Systems  Positive: Sore throat Negative: Fever  Physical Exam  BP 139/81 (BP Location: Left Arm)    Pulse 88    Temp 98.9 F (37.2 C) (Oral)    Resp 16    Ht 5\' 11"  (1.803 m)    Wt 135 kg    SpO2 97%    BMI 41.51 kg/m  Gen:   Awake, no distress , hot potato voice Resp:  Normal effort  MSK:   Moves extremities without difficulty  Other:  Tolerating secretions, fullness to predominantly left side palate anterior to left tonsil  Medical Decision Making  Medically screening exam initiated at 7:53 PM.  Appropriate orders placed.  II was informed that the remainder of the evaluation will be completed by another provider, this initial triage assessment does not replace that evaluation, and the importance of remaining in the ED until their evaluation is complete.     Toma Copier, PA-C 12/07/21 2002

## 2021-12-08 MED ORDER — CLINDAMYCIN HCL 150 MG PO CAPS: 150.0000 mg | ORAL_CAPSULE | Freq: Four times a day (QID) | ORAL | 0 refills | Status: AC

## 2021-12-08 MED ORDER — DEXAMETHASONE 2 MG PO TABS: 10.0000 mg | ORAL_TABLET | Freq: Two times a day (BID) | ORAL | 0 refills | Status: AC

## 2021-12-08 MED ORDER — CLINDAMYCIN HCL 150 MG PO CAPS
300.0000 mg | ORAL_CAPSULE | Freq: Once | ORAL | Status: AC
  Administered 2021-12-08: 300 mg via ORAL
  Filled 2021-12-08: qty 2

## 2021-12-08 NOTE — ED Notes (Signed)
Attempted to start IVF ordered by [facility]. Pt refused became agitated and stood up stating that he does not want IVF. He wants to talk to the doctor and get his prescriptions. RN will return to reoffer fluids after seen by EDP

## 2021-12-08 NOTE — ED Provider Notes (Signed)
[facility] EMERGENCY DEPARTMENT Provider Note   CSN: [id number] Arrival date & time: 12/07/21  1751     History  Chief Complaint  Patient presents with   Sore Throat    Shawn Li is a 41 y.o. adult.  A few days of sore throat, worse on left. Went to UC, sent here for concern of PTA. Given decadron at [facility] and has had improvement. No significant pain with swallowing. No difficulty breathing. Tolerating secretions.    Sore Throat This is a new problem. The current episode started more than 2 days ago. The problem occurs constantly. The problem has been gradually worsening. Pertinent negatives include no chest pain, no abdominal pain, no headaches and no shortness of breath. Nothing aggravates the symptoms. Nothing relieves the symptoms. Treatments tried: decadron at [facility]. The treatment provided significant relief.      Home Medications Prior to Admission medications   Medication Sig Start Date End Date Taking? Authorizing Provider  clindamycin (CLEOCIN) 150 MG capsule Take 1 capsule (150 mg total) by mouth every 6 (six) hours. 12/08/21  Yes Gunnison Chahal, Barbara Cower, MD  dexamethasone (DECADRON) 2 MG tablet Take 5 tablets (10 mg total) by mouth 2 (two) times daily with a meal for 2 days. 12/08/21 12/10/21 Yes Deyjah Kindel, Barbara Cower, MD  cyclobenzaprine (FLEXERIL) 10 MG tablet Take 1 tablet (10 mg total) by mouth 2 (two) times daily as needed for muscle spasms. 06/13/16   Danelle Berry, PA-C  ibuprofen (ADVIL,MOTRIN) 800 MG tablet Take 1 tablet (800 mg total) by mouth 3 (three) times daily. 06/13/16   Danelle Berry, PA-C  meloxicam (MOBIC) 15 MG tablet Take 1 tablet (15 mg total) by mouth daily. Take 1 daily with food. 04/08/17   Harris, Cammy Copa, PA-C  traMADol (ULTRAM) 50 MG tablet Take 1 tablet (50 mg total) by mouth every 6 (six) hours as needed. 04/08/17   Arthor Captain, PA-C      Allergies    Patient has no known allergies.    Review of Systems   Review of Systems  Respiratory:   Negative for shortness of breath.   Cardiovascular:  Negative for chest pain.  Gastrointestinal:  Negative for abdominal pain.  Neurological:  Negative for headaches.   Physical Exam Updated Vital Signs BP 131/82 (BP Location: Left Arm)    Pulse 84    Temp 98.3 F (36.8 C) (Oral)    Resp 16    Ht 5\' 11"  (1.803 m)    Wt 135 kg    SpO2 97%    BMI 41.51 kg/m  Physical Exam Vitals and nursing note reviewed.  Constitutional:      Appearance: She is well-developed.  HENT:     Head: Normocephalic and atraumatic.     Mouth/Throat:     Mouth: Mucous membranes are moist.  Cardiovascular:     Rate and Rhythm: Normal rate.  Pulmonary:     Effort: Pulmonary effort is normal. No respiratory distress.  Abdominal:     General: There is no distension.  Musculoskeletal:        General: Normal range of motion.     Cervical back: Normal range of motion.  Skin:    General: Skin is warm and dry.     Coloration: Skin is not pale.     Findings: No erythema.  Neurological:     Mental Status: She is alert.    ED Results / Procedures / Treatments   Labs (all labs ordered are listed, but only  abnormal results are displayed) Labs Reviewed  GROUP A STREP BY PCR - Abnormal; Notable for the following components:      Result Value   Group A Strep by PCR DETECTED (*)    All other components within normal limits  BASIC METABOLIC PANEL - Abnormal; Notable for the following components:   Glucose, Bld 111 (*)    Calcium 8.8 (*)    All other components within normal limits  CBC WITH DIFFERENTIAL/PLATELET - Abnormal; Notable for the following components:   WBC 21.6 (*)    Platelets 449 (*)    Neutro Abs 19.7 (*)    Abs Immature Granulocytes 0.12 (*)    All other components within normal limits  RESP PANEL BY RT-PCR (FLU A&B, COVID) [id number]    EKG None  Radiology CT Soft Tissue Neck W Contrast  Result Date: 12/07/2021 CLINICAL DATA:  Initial evaluation for acute sore throat. EXAM: CT NECK WITH  CONTRAST TECHNIQUE: Multidetector CT imaging of the neck was performed using the standard protocol following the bolus administration of intravenous contrast. RADIATION DOSE REDUCTION: This exam was performed according to the departmental dose-optimization program which includes automated exposure control, adjustment of the mA and/or kV according to patient size and/or use of iterative reconstruction technique. CONTRAST:  30mL OMNIPAQUE IOHEXOL 300 MG/ML  SOLN COMPARISON:  None available. FINDINGS: Pharynx and larynx: Oral cavity within normal limits. No acute inflammatory changes seen about the teeth. Palatine tonsils are enlarged and hypertrophied bilaterally, suggesting acute tonsillitis. There is a somewhat ill-defined hypodense area at the left tonsil measuring 2.1 x 1.6 x 1.7 cm, consistent with early and/or developing tonsillar/peritonsillar abscess (series 10, image 28). Associated inflammatory stranding within the adjacent left parapharyngeal space. Mild mucosal edema within the left pharynx. No retropharyngeal collection. Epiglottis itself within normal limits. Vallecula clear. Remainder of the hypopharynx and supraglottic larynx within normal limits. Glottis normal. Subglottic airway patent clear. Salivary glands: Parotid glands within normal limits. Right submandibular gland normal. Hazy inflammatory stranding noted within the left submandibular space, surrounding the left submandibular gland related to the acute left-sided tonsillitis/pharyngitis. The gland itself is felt to be within normal limits. No sialolithiasis. Thyroid: Normal. Lymph nodes: Mildly prominent level Li/III lymph nodes measure up to 1.4 cm, likely reactive. No suppuration or intradural abscess formation. Vascular: Normal intravascular enhancement seen within the neck. Limited intracranial: Unremarkable. Visualized orbits: Not visualized on this exam. Mastoids and visualized paranasal sinuses: Mild mucosal thickening noted about the  visualized ethmoidal air cells and maxillary sinuses. Visualized paranasal sinuses are otherwise clear. Visualized mastoids and middle ear cavities are well pneumatized and free of fluid. Skeleton: No discrete or worrisome osseous lesions. Upper chest: Visualized upper chest demonstrates no acute finding. Partially visualized lungs are clear. Other: None. IMPRESSION: 1. Findings consistent with acute left-sided tonsillitis/pharyngitis. Superimposed 2.1 x 1.6 x 1.7 cm hypodense area at the left tonsil consistent with early and/or developing tonsillar/peritonsillar abscess. 2. Mildly prominent left greater than right cervical adenopathy, presumably reactive. Electronically Signed   By: Rise Mu M.D.   On: 12/07/2021 23:44    Procedures Procedures    Medications Ordered in ED Medications  sodium chloride 0.9 % bolus 1,000 mL (1,000 mLs Intravenous Patient Refused/Not Given 12/08/21 0049)  iohexol (OMNIPAQUE) 300 MG/ML solution 75 mL (75 mLs Intravenous Contrast Given 12/07/21 2205)  clindamycin (CLEOCIN) capsule 300 mg (300 mg Oral Given 12/08/21 0122)    ED Course/ Medical Decision Making/ A&P  Medical Decision Making Risk Prescription drug management.   Possibly small pTA on CT scan. Discussed with patient the difficulty in determining if it was an abscess and moreover the difficulty of aspirating a small area like that. He has had improvement with symptoms. Will plan for ENT referral, abx and continued steroids at home. Discussed returning to the ED for any worsening, not improving, difficulty breathign or swallowing.     Final Clinical Impression(s) / ED Diagnoses Final diagnoses:  Strep pharyngitis  Peritonsillar abscess    Rx / DC Orders ED Discharge Orders          Ordered    dexamethasone (DECADRON) 2 MG tablet  2 times daily with meals        12/08/21 0118    clindamycin (CLEOCIN) 150 MG capsule  Every 6 hours        12/08/21 0118     Ambulatory referral to ENT        12/08/21 0119              Gavyn Ybarra, Barbara Cower, MD 12/08/21 [phone]

## 2023-11-08 ENCOUNTER — Ambulatory Visit
Admission: EM | Admit: 2023-11-08 | Discharge: 2023-11-08 | Disposition: A | Discharge status: Home / Self Care | Payer: BC Managed Care – PPO | Attending: Family Medicine | Admitting: Family Medicine

## 2023-11-08 DIAGNOSIS — S29012A Strain of muscle and tendon of back wall of thorax, initial encounter: Secondary | ICD-10-CM | POA: Diagnosis not present

## 2023-11-08 MED ORDER — CYCLOBENZAPRINE HCL 10 MG PO TABS: 10.0000 mg | ORAL_TABLET | Freq: Two times a day (BID) | ORAL | 0 refills | Status: AC | PRN

## 2023-11-08 MED ORDER — KETOROLAC TROMETHAMINE 30 MG/ML IJ SOLN
30.0000 mg | Freq: Once | INTRAMUSCULAR | Status: AC
  Administered 2023-11-08: 30 mg via INTRAMUSCULAR

## 2023-11-08 NOTE — ED Provider Notes (Signed)
 [facility]    CSN: [id number] Arrival date & time: 11/08/23  1015      History   Chief Complaint Chief Complaint  Patient presents with   Back Pain    HPI Shawn Li is a 43 y.o. adult presents for evaluation of back pain.  Patient reports ago he developed an intermittent nonradiating spasm that is located in his left upper back.  Denies any known injury or inciting event.  Denies any history of injuries or surgeries to the back in the past.  He has been doing ibuprofen , Biofreeze, heat, and massage with some improvement.  Pain is worse with laying down, twisting or bending. No numbness/tingling, or weakness of his upper extremities. No neck or lower back pain.  No other concerns at this time.    Back Pain   Past Medical History:  Diagnosis Date   Brain trauma (HCC)     There are no active problems to display for this patient.   History reviewed. No pertinent surgical history.     Home Medications    Prior to Admission medications   Medication Sig Start Date End Date Taking? Authorizing Provider  cyclobenzaprine  (FLEXERIL ) 10 MG tablet Take 1 tablet (10 mg total) by mouth 2 (two) times daily as needed for up to 5 days for muscle spasms. 11/08/23 11/13/23 Yes Mekhia Brogan, Jodi R, NP  clindamycin  (CLEOCIN ) 150 MG capsule Take 1 capsule (150 mg total) by mouth every 6 (six) hours. 12/08/21   Mesner, Selinda, MD  ibuprofen  (ADVIL ,MOTRIN ) 800 MG tablet Take 1 tablet (800 mg total) by mouth 3 (three) times daily. 06/13/16   Tapia, Leisa, PA-C  meloxicam  (MOBIC ) 15 MG tablet Take 1 tablet (15 mg total) by mouth daily. Take 1 daily with food. 04/08/17   Harris, Abigail, PA-C  traMADol  (ULTRAM ) 50 MG tablet Take 1 tablet (50 mg total) by mouth every 6 (six) hours as needed. 04/08/17   Arloa Chroman, PA-C    Family History Family History  Problem Relation Age of Onset   Cancer Mother     Social History Social History   Tobacco Use   Smoking status: Former     Types: Cigarettes   Smokeless tobacco: Never  Vaping Use   Vaping status: Every Day  Substance Use Topics   Alcohol use: Yes    Comment: occ   Drug use: Yes    Types: Marijuana     Allergies   Patient has no known allergies.   Review of Systems Review of Systems  Musculoskeletal:  Positive for back pain.     Physical Exam Triage Vital Signs ED Triage Vitals  Encounter Vitals Group     BP 11/08/23 1029 (!) 140/83     Systolic BP Percentile --      Diastolic BP Percentile --      Pulse Rate 11/08/23 1029 91     Resp 11/08/23 1029 18     Temp 11/08/23 1029 98.9 F (37.2 C)     Temp Source 11/08/23 1029 Oral     SpO2 11/08/23 1029 95 %     Weight --      Height --      Head Circumference --      Peak Flow --      Pain Score 11/08/23 1024 5     Pain Loc --      Pain Education --      Exclude from Growth Chart --    No data  found.  Updated Vital Signs BP (!) 140/83 (BP Location: Left Arm)   Pulse 91   Temp 98.9 F (37.2 C) (Oral)   Resp 18   SpO2 95%   Visual Acuity Right Eye Distance:   Left Eye Distance:   Bilateral Distance:    Right Eye Near:   Left Eye Near:    Bilateral Near:     Physical Exam Vitals and nursing note reviewed.  Constitutional:      General: She is not in acute distress.    Appearance: Normal appearance. She is obese. She is not ill-appearing.  HENT:     Head: Normocephalic and atraumatic.  Eyes:     Pupils: Pupils are equal, round, and reactive to light.  Cardiovascular:     Rate and Rhythm: Normal rate.  Pulmonary:     Effort: Pulmonary effort is normal.  Musculoskeletal:     Cervical back: Normal.     Thoracic back: Spasms and tenderness present. No swelling, edema, deformity, signs of trauma, lacerations or bony tenderness. Normal range of motion. No scoliosis.     Lumbar back: Normal.       Back:     Comments: Strength 5/5 BUE   Skin:    General: Skin is warm and dry.  Neurological:     General: No focal  deficit present.     Mental Status: She is alert and oriented to person, place, and time.  Psychiatric:        Mood and Affect: Mood normal.        Behavior: Behavior normal.      UC Treatments / Results  Labs (all labs ordered are listed, but only abnormal results are displayed) Labs Reviewed - No data to display  EKG   Radiology No results found.  Procedures Procedures (including critical care time)  Medications Ordered in UC Medications  ketorolac  (TORADOL ) 30 MG/ML injection 30 mg (30 mg Intramuscular Given 11/08/23 1056)    Initial Impression / Assessment and Plan / UC Course  I have reviewed the triage vital signs and the nursing notes.  Pertinent labs & imaging results that were available during my care of the patient were reviewed by me and considered in my medical decision making (see chart for details).     Reviewed exam and sx with patient. No red flags. Discussed muscle strain of back.  Patient given Toradol  injection in clinic.  He was monitored for 10 minutes after injection with no reaction noted and tolerated well.  He was instructed no NSAIDs for 24 hours and verbalized understanding.  Trial of Flexeril  twice daily x 5 days.  Side effect profile reviewed.  Continue heat and rest as needed.  PCP follow-up if symptoms do not improve.  ER precautions reviewed. Final Clinical Impressions(s) / UC Diagnoses   Final diagnoses:  Muscle strain of left upper back, initial encounter     Discharge Instructions      You were given a Toradol  injection in clinic today. Do not take any over the counter NSAID's such as Advil , ibuprofen , Aleve , or naproxen  for 24 hours. You may take tylenol if needed. You may take flexeril  as needed. Please note this medication will make you drowsy.  Do not drink alcohol or drive on this medicine. Continue heat and rest. Please follow up with your PCP if symptoms do not improve. Please go to the ER for any worsening symptoms. I hope you  feel better soon!      ED Prescriptions  Medication Sig Dispense Auth. Provider   cyclobenzaprine  (FLEXERIL ) 10 MG tablet Take 1 tablet (10 mg total) by mouth 2 (two) times daily as needed for up to 5 days for muscle spasms. 10 tablet Spencer Cardinal, Jodi R, NP      PDMP not reviewed this encounter.   Loreda Myla SAUNDERS, NP 11/08/23 [phone]

## 2023-11-08 NOTE — Discharge Instructions (Signed)
 You were given a Toradol  injection in clinic today. Do not take any over the counter NSAID's such as Advil , ibuprofen , Aleve , or naproxen  for 24 hours. You may take tylenol if needed. You may take flexeril  as needed. Please note this medication will make you drowsy.  Do not drink alcohol or drive on this medicine. Continue heat and rest. Please follow up with your PCP if symptoms do not improve. Please go to the ER for any worsening symptoms. I hope you feel better soon!

## 2023-11-08 NOTE — ED Triage Notes (Signed)
 Pt presents to UC for c/o pulled muscle in back 3 days ago. Pain is located on right middle back and worsens with lying down, bending at the waist, or sitting. Ibuprofen and biofreeze do not help

## 2023-12-11 IMAGING — CT CT NECK W/ CM
4 of 6 series · 12 of 34 positions shown, 14 images · IV contrast (APPLIED)
Comparison: None available.

CLINICAL DATA: Initial evaluation for acute sore throat.

EXAM:
CT NECK WITH CONTRAST
TECHNIQUE: Multidetector CT imaging of the neck was performed using the
standard protocol following the bolus administration of intravenous
contrast.

[Series 8: cor neck 1 · coronal · 0.38mm/px · 3 of 104 slices shown]
[im 32/104  bone]
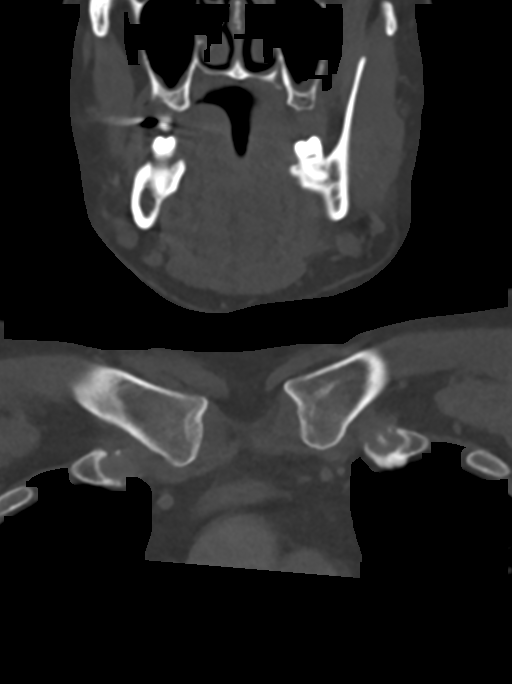
[im 45/104  bone]
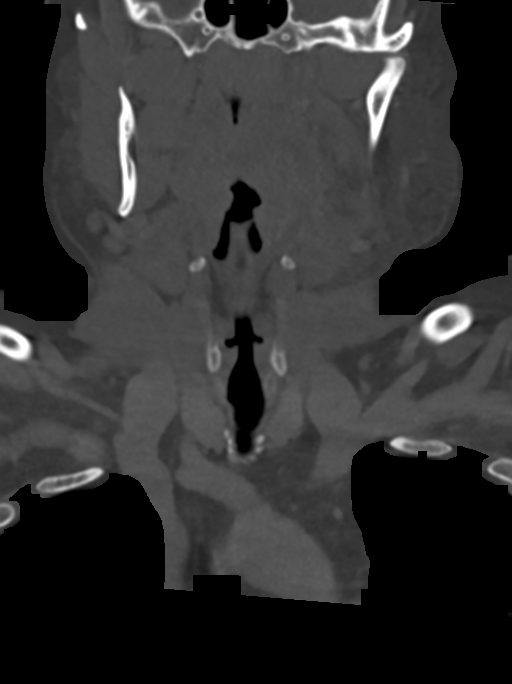
[im 59/104  bone]
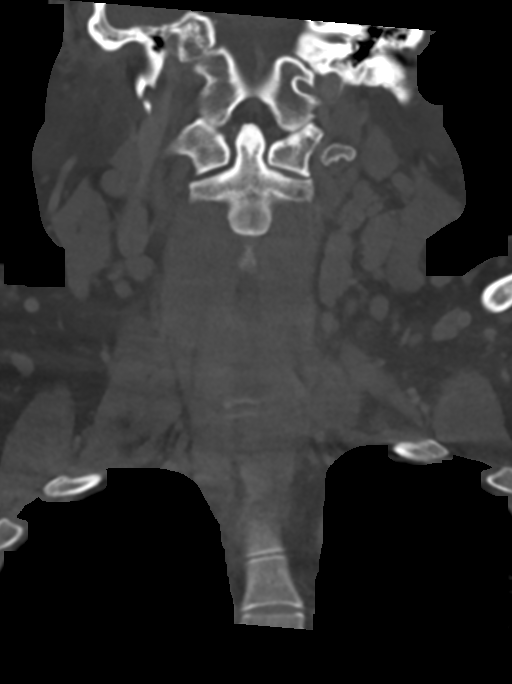

[Series 9: sag neck 1 · sagittal · 0.41mm/px · 5 of 99 slices shown, 6 images]
[im 33/99  bone]
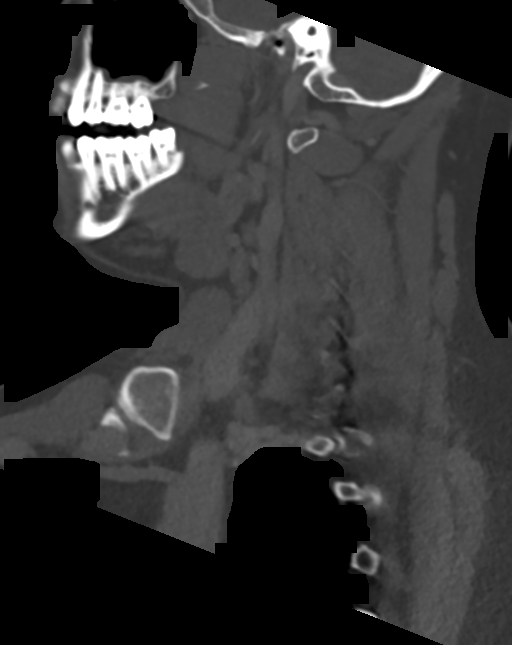
[im 41/99  bone]
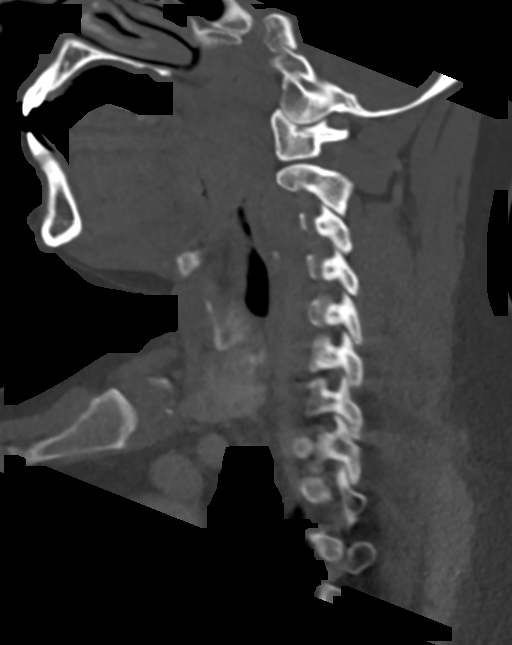
[im 50/99  soft-tissue]
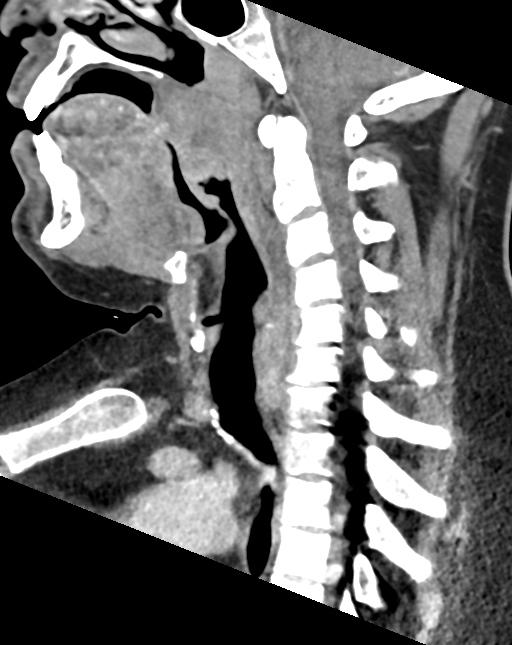
[im 50/99  bone]
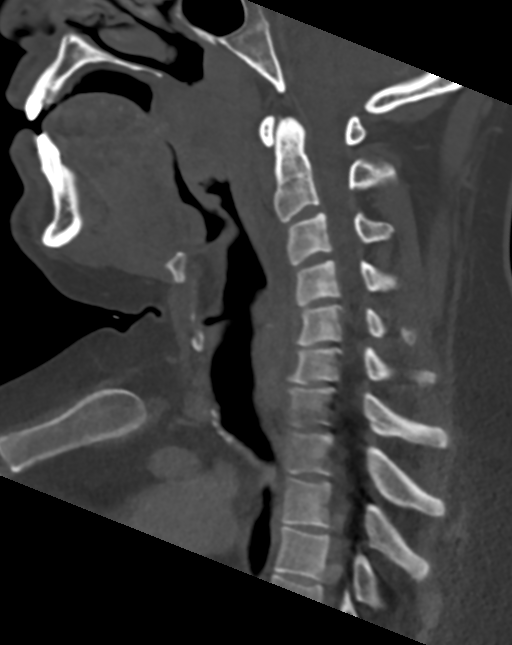
[im 58/99  bone]
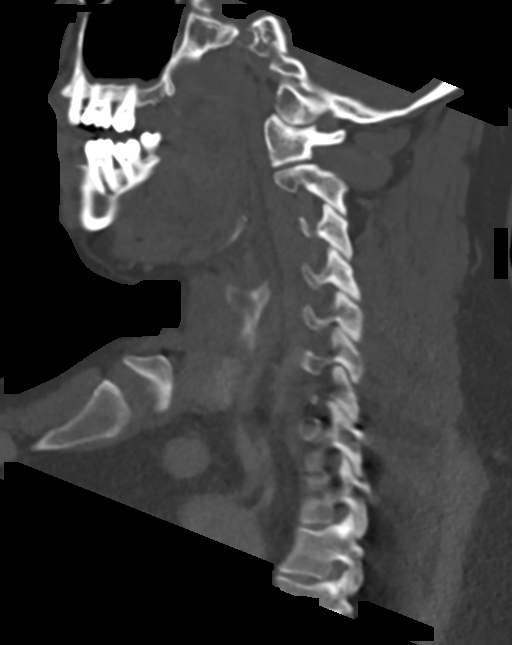
[im 66/99  bone]
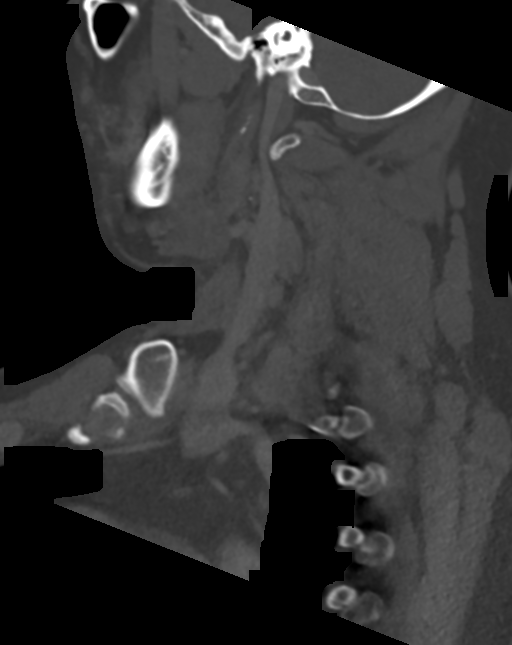

[Series 10: ax oropharynx 1 · axial · 0.38mm/px · z∈[-364,-282]mm · 2 of 132 slices shown, 3 images]
[im 44/132  soft-tissue]
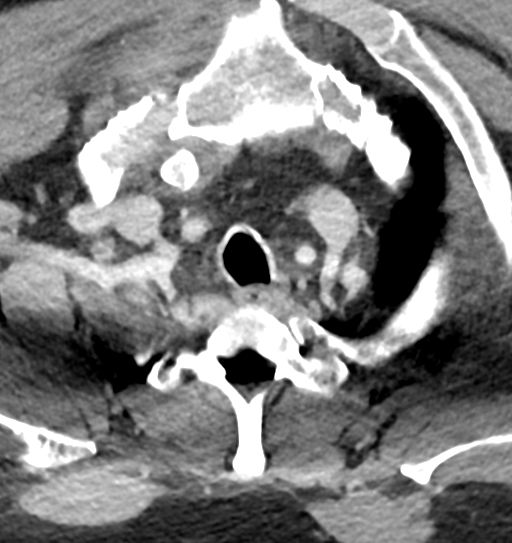
[im 44/132  bone]
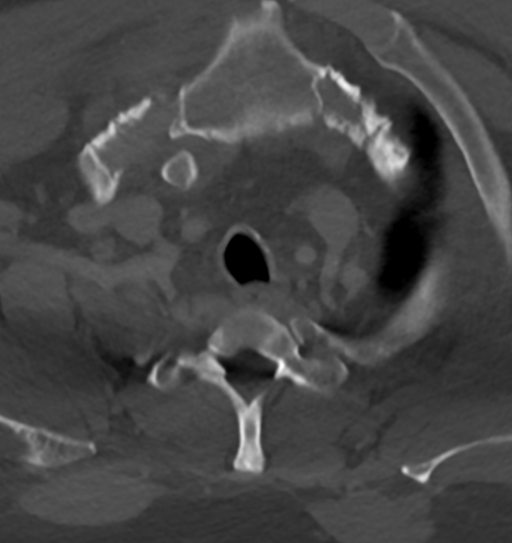
[im 88/132  bone]
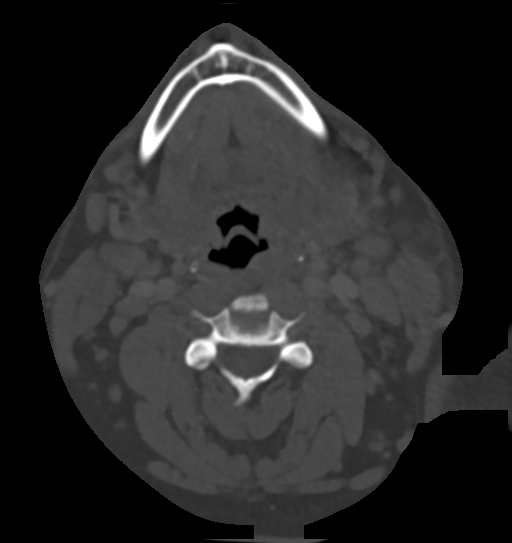

[Series 11: axial bone 1 · axial · 0.38mm/px · z∈[-364,-282]mm · 2 of 132 slices shown]
[im 44/132  bone]
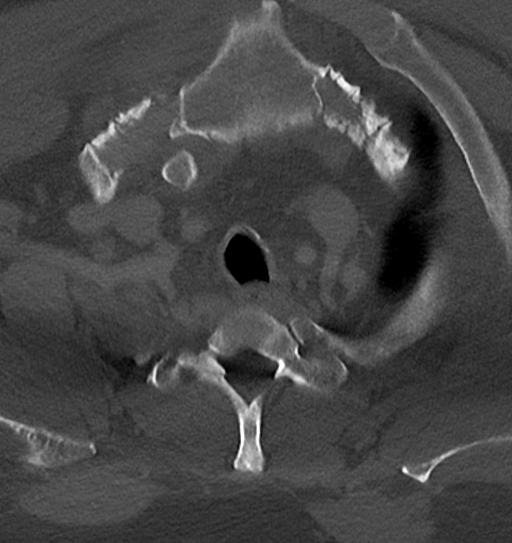
[im 88/132  bone]
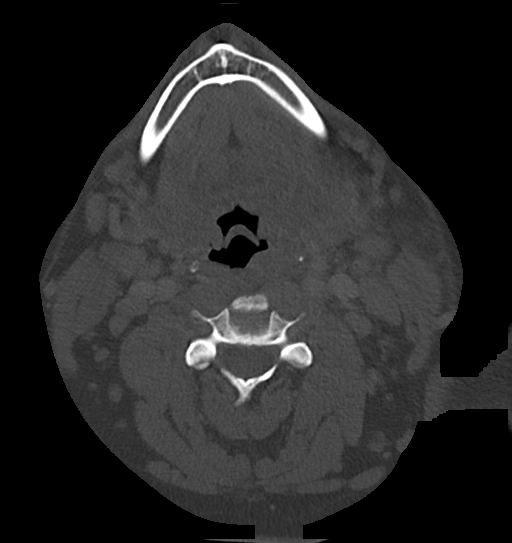

[12 of 34 positions shown; findings below may reference images not displayed]

RADIATION DOSE REDUCTION: This exam was performed according to the
departmental dose-optimization program which includes automated
exposure control, adjustment of the mA and/or kV according to
patient size and/or use of iterative reconstruction technique.

CONTRAST:  75mL OMNIPAQUE IOHEXOL 300 MG/ML  SOLN
FINDINGS: Pharynx and larynx: Oral cavity within normal limits. No acute
inflammatory changes seen about the teeth. Palatine tonsils are
enlarged and hypertrophied bilaterally, suggesting acute
tonsillitis. There is a somewhat ill-defined hypodense area at the
left tonsil measuring 2.1 x 1.6 x 1.7 cm, consistent with early
and/or developing tonsillar/peritonsillar abscess (series 10, image
28). Associated inflammatory stranding within the adjacent left
parapharyngeal space. Mild mucosal edema within the left pharynx. No
retropharyngeal collection. Epiglottis itself within normal limits.
Vallecula clear. Remainder of the hypopharynx and supraglottic
larynx within normal limits. Glottis normal. Subglottic airway
patent clear.

Salivary glands: Parotid glands within normal limits. Right
submandibular gland normal. Hazy inflammatory stranding noted within
the left submandibular space, surrounding the left submandibular
gland related to the acute left-sided tonsillitis/pharyngitis. The
gland itself is felt to be within normal limits. No sialolithiasis.

Thyroid: Normal.

Lymph nodes: Mildly prominent level II/III lymph nodes measure up to
1.4 cm, likely reactive. No suppuration or intradural abscess
formation.

Vascular: Normal intravascular enhancement seen within the neck.

Limited intracranial: Unremarkable.

Visualized orbits: Not visualized on this exam.

Mastoids and visualized paranasal sinuses: Mild mucosal thickening
noted about the visualized ethmoidal air cells and maxillary
sinuses. Visualized paranasal sinuses are otherwise clear.
Visualized mastoids and middle ear cavities are well pneumatized and
free of fluid.

Skeleton: No discrete or worrisome osseous lesions.

Upper chest: Visualized upper chest demonstrates no acute finding.
Partially visualized lungs are clear.

Other: None.
IMPRESSION: 1. Findings consistent with acute left-sided
tonsillitis/pharyngitis. Superimposed 2.1 x 1.6 x 1.7 cm hypodense
area at the left tonsil consistent with early and/or developing
tonsillar/peritonsillar abscess.
2. Mildly prominent left greater than right cervical adenopathy,
presumably reactive.
